# Patient Record
Sex: Female | Born: 1940 | Race: White | Hispanic: No | Marital: Married | Smoking: Never smoker
Health system: Southern US, Community
[De-identification: ages and names within clinical notes are randomized; demographics above are authoritative.]

## PROBLEM LIST (undated history)

## (undated) DIAGNOSIS — I10 Essential (primary) hypertension: Secondary | ICD-10-CM

## (undated) DIAGNOSIS — E7800 Pure hypercholesterolemia, unspecified: Secondary | ICD-10-CM

## (undated) HISTORY — DX: Pure hypercholesterolemia, unspecified: E78.00

## (undated) HISTORY — DX: Essential (primary) hypertension: I10

## (undated) HISTORY — PX: TUBAL LIGATION: SHX77

---

## 1997-03-18 HISTORY — PX: BACK SURGERY: SHX140

## 2011-11-01 ENCOUNTER — Encounter: Payer: Self-pay | Admitting: Internal Medicine

## 2011-11-01 ENCOUNTER — Ambulatory Visit (INDEPENDENT_AMBULATORY_CARE_PROVIDER_SITE_OTHER)
Admission: RE | Admit: 2011-11-01 | Discharge: 2011-11-01 | Disposition: A | Discharge status: Home / Self Care | Payer: Medicare Other | Source: Ambulatory Visit | Attending: Internal Medicine | Admitting: Internal Medicine

## 2011-11-01 ENCOUNTER — Ambulatory Visit (INDEPENDENT_AMBULATORY_CARE_PROVIDER_SITE_OTHER): Payer: Medicare Other | Admitting: Internal Medicine

## 2011-11-01 VITALS — BP 128/90 | HR 70 | Ht 61.0 in | Wt 182.0 lb

## 2011-11-01 DIAGNOSIS — R0989 Other specified symptoms and signs involving the circulatory and respiratory systems: Secondary | ICD-10-CM

## 2011-11-01 DIAGNOSIS — R0600 Dyspnea, unspecified: Secondary | ICD-10-CM

## 2011-11-01 DIAGNOSIS — R0609 Other forms of dyspnea: Secondary | ICD-10-CM

## 2011-11-01 NOTE — Patient Instructions (Addendum)
Order- CXR                                                          Dx dyspnea             PFT             6 MWT              Test for a1AT deficiency

## 2011-11-01 NOTE — Progress Notes (Signed)
11/01/11- 57 yoF never smoker  For pulmonary evaluation-Patient c/o  sob with daily activities and wheezing. Denies cough, chest pain, and chest tightness.  Sister in law is a patient here- referred.  Describes shortness of breath x 2 years, noticed especially at night and relieved by sitting up and taking deep breaths. Triggers include heat, exercise like walking. She denies a past diagnosis of asthma but told she had pneumonia-hospitalized 2011. Previously had rescue inhaler. Denies cough or chest pain. Sleeps on 2 pillows with occasional orthopnea or paroxysmal nocturnal dyspnea. Feet swell. Denies history of DVT in herself or family. Treated for high blood pressure. Heart catheterization at [facility] said to be normal in 2011 so she says she has no heart disease. Husband had been a smoker years ago. 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  Prior to Admission medications   Medication Sig Start Date End Date Taking? Authorizing Provider  albuterol (PROVENTIL HFA;VENTOLIN HFA) 108 (90 BASE) MCG/ACT inhaler Inhale 2 puffs into the lungs. 2 to 3 times a day as needed   Yes Historical Provider, MD  atenolol (TENORMIN) 25 MG tablet Take 25 mg by mouth daily.   Yes Historical Provider, MD  calcium carbonate (OS-CAL) 600 MG TABS Take 500 mg by mouth 2 (two) times daily with a meal.   Yes Historical Provider, MD  cholecalciferol (VITAMIN D) 1000 UNITS tablet Take 1,000 Units by mouth daily.   Yes Historical Provider, MD  levothyroxine (SYNTHROID, LEVOTHROID) 25 MCG tablet Take 25 mcg by mouth daily.   Yes Historical Provider, MD  Multiple Vitamins-Minerals (MULTIVITAMIN PO) Take by mouth daily.   Yes Historical Provider, MD  Omega-3 Fatty Acids (FISH OIL) 1200 MG CAPS Take 2 capsules by mouth.   Yes Historical Provider, MD  simvastatin (ZOCOR) 20 MG tablet Take 20 mg by mouth every evening.   Yes Historical Provider, MD   Past Medical History  Diagnosis  Date  . High blood pressure   . High cholesterol    Past Surgical History  Procedure Date  . Back surgery 1999  . Tubal ligation    Family History  Problem Relation Age of Onset  . Emphysema Sister   . Emphysema      niece  . Heart disease Father   . Heart disease Brother   . Kidney cancer Brother     1 kidney removed   History   Social History  . Marital Status: Married    Spouse Name: N/A    Number of Children: 2  . Years of Education: N/A   Occupational History  . retired    Social History Main Topics  . Smoking status: Never Smoker   . Smokeless tobacco: Never Used  . Alcohol Use: Yes     wine rarely  . Drug Use: No  . Sexually Active: Not on file   Other Topics Concern  . Not on file   Social History Narrative  . No narrative on file   ROS-see HPI Constitutional:   No-   weight loss, night sweats, fevers, chills, fatigue, lassitude. HEENT:   No-  headaches, difficulty swallowing, +tooth/dental problems, sore throat,       No-  Sneezing,+ itching,No- ear ache, nasal congestion, post nasal drip,  CV:  No-   chest pain, orthopnea, PND, +swelling in lower extremities, anasarca, dizziness, palpitations Resp: + shortness of breath with exertion or at rest.  No-   productive cough,  No non-productive cough,  No- coughing up of blood.              No-   change in color of mucus.  + wheezing.   Skin: No-   rash or lesions. GI:  No-   heartburn, indigestion, abdominal pain, nausea, vomiting, diarrhea,                 change in bowel habits, loss of appetite GU: No-   dysuria, change in color of urine, no urgency or frequency.  No- flank pain. MS:  No-   joint pain or swelling.  No- decreased range of motion.  No- back pain. Neuro-     nothing unusual Psych:  No- change in mood or affect. No depression or anxiety.  No memory loss.  OBJ- Physical Exam BP 128/90  Pulse 70  Ht 5\' 1"  (1.549 m)  Wt 182 lb (82.555 kg)  BMI 34.39 kg/m2  SpO2  94%  General- Alert, Oriented, Affect-appropriate, Distress- none acute. Obese, talkative. Skin- rash-none, lesions- none, excoriation- none. Seems cool and clammy, no cyanosis or flushing Lymphadenopathy- none Head- atraumatic            Eyes- Gross vision intact, PERRLA, conjunctivae and secretions clear            Ears- Hearing, canals-normal            Nose- Clear, no-Septal dev, mucus, polyps, erosion, perforation             Throat- Mallampati II , mucosa clear , drainage- none, tonsils- atrophic Neck- flexible , trachea midline, no stridor , thyroid nl, carotid no bruit Chest - symmetrical excursion , unlabored           Heart/CV- RRR , no murmur , no gallop  , no rub, nl s1 s2                           - JVD- none , edema- none, stasis changes- none, varices- none           Lung- clear to P&A, wheeze- none, cough- none , dullness-none, rub- none           Chest wall-  Abd- tender-no, distended-no, bowel sounds-present, HSM- no Br/ Gen/ Rectal- Not done, not indicated Extrem- cyanosis- none, clubbing, none, atrophy- none, strength- nl Neuro- grossly intact to observation

## 2011-11-05 NOTE — Progress Notes (Signed)
Quick Note:  Pt aware of results. ______ 

## 2011-11-08 DIAGNOSIS — J45909 Unspecified asthma, uncomplicated: Secondary | ICD-10-CM | POA: Insufficient documentation

## 2011-11-08 NOTE — Assessment & Plan Note (Signed)
Onset within the last 2 years of dyspnea associated with lying in bed and with exertion. Negative heart catheterization in 2011 and no history of venous thromboembolism or anemia. Probably obesity hypoventilation and deconditioning are important. Plan-chest x-ray, PFT, 6 minute walk test, alpha-1 antitrypsin level

## 2011-12-02 ENCOUNTER — Ambulatory Visit: Payer: Medicare Other | Admitting: Internal Medicine

## 2011-12-03 ENCOUNTER — Ambulatory Visit (INDEPENDENT_AMBULATORY_CARE_PROVIDER_SITE_OTHER): Payer: Medicare Other | Admitting: Internal Medicine

## 2011-12-03 ENCOUNTER — Encounter: Payer: Self-pay | Admitting: Internal Medicine

## 2011-12-03 VITALS — BP 102/76 | HR 82 | Ht 62.0 in | Wt 182.0 lb

## 2011-12-03 DIAGNOSIS — R0609 Other forms of dyspnea: Secondary | ICD-10-CM

## 2011-12-03 DIAGNOSIS — R0600 Dyspnea, unspecified: Secondary | ICD-10-CM

## 2011-12-03 DIAGNOSIS — Z23 Encounter for immunization: Secondary | ICD-10-CM

## 2011-12-03 DIAGNOSIS — R0989 Other specified symptoms and signs involving the circulatory and respiratory systems: Secondary | ICD-10-CM

## 2011-12-03 LAB — PULMONARY FUNCTION TEST

## 2011-12-03 MED ORDER — FLUTICASONE-SALMETEROL 100-50 MCG/DOSE IN AEPB: 1.0000 | INHALATION_SPRAY | Freq: Two times a day (BID) | RESPIRATORY_TRACT | Status: DC

## 2011-12-03 MED ORDER — FUROSEMIDE 20 MG PO TABS: ORAL_TABLET | ORAL | Status: DC

## 2011-12-03 NOTE — Progress Notes (Signed)
PFT done today. 

## 2011-12-03 NOTE — Patient Instructions (Addendum)
Script sent for lasix/ furosemide    1 daily x 3 days  See if you loose any water weight  Sample Advair 100      1 puff then rinse mouth, twice daily to stabilize your airways  You can still use the albuterol rescue inhaler every 4 hours as needed  Flu vax  pneumovax

## 2011-12-03 NOTE — Progress Notes (Signed)
Documentation for 6 Minute Walk Test 

## 2011-12-03 NOTE — Progress Notes (Signed)
11/01/11- 63 yoF never smoker  For pulmonary evaluation-Patient c/o  sob with daily activities and wheezing. Denies cough, chest pain, and chest tightness.  Sister in law is a patient here- referred.  Describes shortness of breath x 2 years, noticed especially at night and relieved by sitting up and taking deep breaths. Triggers include heat, exercise like walking. She denies a past diagnosis of asthma but told she had pneumonia-hospitalized 2011. Previously had rescue inhaler. Denies cough or chest pain. Sleeps on 2 pillows with occasional orthopnea or paroxysmal nocturnal dyspnea. Feet swell. Denies history of DVT in herself or family. Treated for high blood pressure. Heart catheterization at [facility] said to be normal in 2011 so she says she has no heart disease. Husband had been a smoker years ago. 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  12/03/11- 69 yoF never smoker  For pulmonary evaluation-Patient c/o  sob with daily activities and wheezing. Denies cough, chest pain, and chest tightness.   PCP Dr Felix Pacini No change in breathing--about same.  PFT today. Mentions easy diaphoresis and early fatigue with exertion but not as much wheeze and cough while lying down as she was having before. Rescue inhaler does help. Uses it before going to sleep. CXR 11/05/11- reviewed with her 1. No acute process.  2. Bibasilar volume loss/scarring with nonspecific bronchial wall  thickening. Slightly greater on the left than right.  Original Report Authenticated By: Consuello Bossier, M.D.  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 FEV1/FVC 0.78, TLC 79%, DLCO 61%. 6 MWT-12/03/2011-96%, 96%, 97%, 408 m. She completed walk without stopping but complained of frontal headache, bilateral calf pains and tightness in the center of her chest. She did not have a  pulmonary test limitation but angina and claudication are not excluded from this description.   ROS-see HPI Constitutional:   No-   weight loss, night sweats, fevers, chills, fatigue, lassitude. HEENT:   No-  headaches, difficulty swallowing, +tooth/dental problems, sore throat,       No-  Sneezing,+ itching,No- ear ache, nasal congestion, post nasal drip,  CV:  No-   chest pain, orthopnea, PND, +swelling in lower extremities, anasarca, dizziness, palpitations Resp: + shortness of breath with exertion or at rest.              No-   productive cough,  + little non-productive cough,  No- coughing up of blood.              No-   change in color of mucus.  + Less wheezing.   Skin: No-   rash or lesions. GI:  No-   heartburn, indigestion, abdominal pain, nausea, vomiting,  GU:  MS:  No-   joint pain or swelling.   Neuro-     nothing unusual Psych:  No- change in mood or affect. No depression or anxiety.  No memory loss.  OBJ- Physical Exam BP 102/76  Pulse 82  Ht 5\' 2"  (1.575 m)  Wt 182 lb (82.555 kg)  BMI 33.29 kg/m2  SpO2 95% General- Alert, Oriented, Affect-appropriate, Distress- none acute. Obese, talkative. Skin- rash-none, lesions- none, excoriation- none.  Lymphadenopathy- none Head- atraumatic            Eyes- Gross vision intact, PERRLA, conjunctivae and secretions clear            Ears- Hearing, canals-normal  Nose- Clear, no-Septal dev, mucus, polyps, erosion, perforation             Throat- Mallampati II , mucosa clear , drainage- none, tonsils- atrophic Neck- flexible , trachea midline, no stridor , thyroid nl, carotid no bruit Chest - symmetrical excursion , unlabored           Heart/CV- RRR , no murmur , no gallop  , no rub, nl s1 s2                           - JVD-+ trace , edema+1, stasis changes- none, varices- none           Lung- clear to P&A, wheeze- none, cough- none , dullness-none, rub- none           Chest wall-  Abd-  Br/ Gen/ Rectal- Not done,  not indicated Extrem- cyanosis- none, clubbing, none, atrophy- none, strength- nl Neuro- grossly intact to observation

## 2011-12-05 ENCOUNTER — Encounter: Payer: Self-pay | Admitting: Internal Medicine

## 2011-12-11 NOTE — Assessment & Plan Note (Addendum)
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 6 minute walk test does not suggest pulmonary limitations but raises question of circulatory(angina/claudication) limitation of exertion. This is supported by bronchial thickening on chest x-ray. There is more fluid in the neck veins and ankles today so we will see what difference a few days of Lasix and make. Meanwhile try Advair with discussion. Her primary physician can consider cardiology referral would be useful for risk stratification.

## 2012-01-14 ENCOUNTER — Ambulatory Visit (INDEPENDENT_AMBULATORY_CARE_PROVIDER_SITE_OTHER): Payer: Medicare Other | Admitting: Internal Medicine

## 2012-01-14 ENCOUNTER — Encounter: Payer: Self-pay | Admitting: Internal Medicine

## 2012-01-14 VITALS — BP 116/70 | HR 75 | Ht 61.0 in | Wt 186.0 lb

## 2012-01-14 DIAGNOSIS — J45909 Unspecified asthma, uncomplicated: Secondary | ICD-10-CM

## 2012-01-14 MED ORDER — ALBUTEROL SULFATE HFA 108 (90 BASE) MCG/ACT IN AERS: 2.0000 | INHALATION_SPRAY | RESPIRATORY_TRACT | Status: AC | PRN

## 2012-01-14 NOTE — Patient Instructions (Addendum)
Order- ONOX on room air      Dx asthma with bronchitis  Script sent to drug store for albuterol rescue inhaler to use if needed for wheeze or shortness of breath. For instance you might want to try it before bed to see if it helps the wheezing.

## 2012-01-14 NOTE — Progress Notes (Signed)
11/01/11- 10 yoF never smoker  For pulmonary evaluation-Patient c/o  sob with daily activities and wheezing. Denies cough, chest pain, and chest tightness.  Sister in law is a patient here- referred.  Describes shortness of breath x 2 years, noticed especially at night and relieved by sitting up and taking deep breaths. Triggers include heat, exercise like walking. She denies a past diagnosis of asthma but told she had pneumonia-hospitalized 2011. Previously had rescue inhaler. Denies cough or chest pain. Sleeps on 2 pillows with occasional orthopnea or paroxysmal nocturnal dyspnea. Feet swell. Denies history of DVT in herself or family. Treated for high blood pressure. Heart catheterization at [facility] said to be normal in 2011 so she says she has no heart disease. Husband had been a smoker years ago. 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  12/03/11- 60 yoF never smoker  For pulmonary evaluation-Patient c/o  sob with daily activities and wheezing. Denies cough, chest pain, and chest tightness.   PCP Dr Felix Pacini No change in breathing--about same.  PFT today. Mentions easy diaphoresis and early fatigue with exertion but not as much wheeze and cough while lying down as she was having before. Rescue inhaler does help. Uses it before going to sleep. CXR 11/05/11- reviewed with her 1. No acute process.  2. Bibasilar volume loss/scarring with nonspecific bronchial wall  thickening. Slightly greater on the left than right.  Original Report Authenticated By: Consuello Bossier, M.D.  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 FEV1/FVC 0.78, TLC 79%, DLCO 61%. 6 MWT-12/03/2011-96%, 96%, 97%, 408 m. She completed walk without stopping but complained of frontal headache, bilateral calf pains and tightness in the center of her chest. She did not have a  pulmonary test limitation but angina and claudication are not excluded from this description.  01/14/12- 31 yoF never smoker  For pulmonary evaluation-Patient c/o  sob with daily activities and wheezing. Denies cough, chest pain, and chest tightness.   PCP Dr Felix Pacini Still gets SOB with daily activities; review PFT and test with patient. Has had flu and pneumonia vaccine. Advair sample triggered productive cough with clear phlegm, but otherwise little cough or phlegm. Wheezes briefly just as she gets in bed. Neice has pulmonary hypertension. Trial of Lasix did not change her weight. a1AT MM 11/14/11- normal PFT 12/03/2011 mild obstructive airways disease with response to bronchodilator, slight restriction. FEV1 1.60/91%, FEV1/FVC 0.78, FEF 25-75% 1.4/69%. TLC 79%, DLCO 61%. - 9/17/`3-96%, 96%, 97%, 408 m. Bi-lateral calf pain and mid chest tightness with frontal headache at the end of the walk resolved with rest  ROS-see HPI Constitutional:   No-   weight loss, night sweats, fevers, chills, fatigue, lassitude. HEENT:   No-  headaches, difficulty swallowing, +tooth/dental problems, sore throat,       No-  Sneezing,+ itching,No- ear ache, nasal congestion, post nasal drip,  CV:  +chest pain, orthopnea, PND, +swelling in lower extremities, anasarca, dizziness, palpitations Resp: + shortness of breath with exertion or at rest.              + Little productive cough,  + little non-productive cough,  No- coughing up of blood.              No-   change in color of mucus.  + Less wheezing.   Skin: No-   rash or lesions. GI:  No-  heartburn, indigestion, abdominal pain, nausea, vomiting,  GU:  MS:  No-   joint pain or swelling.   Neuro-     nothing unusual Psych:  No- change in mood or affect. No depression or anxiety.  No memory loss.  OBJ- Physical Exam BP 116/70  Pulse 75  Ht 5\' 1"  (1.549 m)  Wt 186 lb (84.369 kg)  BMI 35.14 kg/m2  SpO2 93% General- Alert, Oriented,  Affect-appropriate, Distress- none acute. Overweight, talkative. Skin- rash-none, lesions- none, excoriation- none.  Lymphadenopathy- none Head- atraumatic            Eyes- Gross vision intact, PERRLA, conjunctivae and secretions clear            Ears- Hearing, canals-normal            Nose- Clear, no-Septal dev, mucus, polyps, erosion, perforation             Throat- Mallampati II , mucosa clear , drainage- none, tonsils- atrophic Neck- flexible , trachea midline, no stridor , thyroid nl, carotid no bruit Chest - symmetrical excursion , unlabored           Heart/CV- RRR , no murmur , no gallop  , no rub, nl s1 s2 (P2 not increased)                           - JVD-none , edema+1, stasis changes- none, varices- none           Lung- clear to P&A, wheeze- none, cough- none , dullness-none, rub- none           Chest wall-  Abd-  Br/ Gen/ Rectal- Not done, not indicated Extrem- cyanosis- none, clubbing, none, atrophy- none, strength- nl Neuro- grossly intact to observation

## 2012-01-26 NOTE — Assessment & Plan Note (Signed)
Consider basis for dyspnea on exertion to be asthma with bronchitis, recognizing reversible obstruction on PFTs. Chest pain, headache and leg pain at the end of the 6 minute walk need watching. I don't think she is describing angina but keep possibility in mind.

## 2012-01-29 ENCOUNTER — Telehealth: Payer: Self-pay | Admitting: Internal Medicine

## 2012-01-29 DIAGNOSIS — J45909 Unspecified asthma, uncomplicated: Secondary | ICD-10-CM

## 2012-01-29 NOTE — Telephone Encounter (Signed)
ONO results received.  Placed on CY's cart w/ copy of message.  Pt is aware CY is not in the office until tomorrow morning, she is okay.  Dr Maple Hudson please advise of ONO, thanks!

## 2012-01-30 NOTE — Telephone Encounter (Signed)
She did desaturate enough on ONOX 01/16/12 to qualify for home O2  Please order Home O2 for sleep   2 L/min    Dx Dyspnea

## 2012-01-30 NOTE — Telephone Encounter (Signed)
Pt aware of results and aware that order has been placed.

## 2012-02-14 ENCOUNTER — Encounter: Payer: Self-pay | Admitting: Internal Medicine

## 2012-05-15 ENCOUNTER — Ambulatory Visit: Payer: Medicare Other | Admitting: Internal Medicine

## 2012-06-18 ENCOUNTER — Ambulatory Visit (INDEPENDENT_AMBULATORY_CARE_PROVIDER_SITE_OTHER): Payer: Medicare Other | Admitting: Internal Medicine

## 2012-06-18 ENCOUNTER — Encounter: Payer: Self-pay | Admitting: Internal Medicine

## 2012-06-18 ENCOUNTER — Other Ambulatory Visit (INDEPENDENT_AMBULATORY_CARE_PROVIDER_SITE_OTHER): Payer: Medicare Other

## 2012-06-18 VITALS — BP 126/72 | HR 81 | Ht 61.0 in | Wt 187.0 lb

## 2012-06-18 DIAGNOSIS — R609 Edema, unspecified: Secondary | ICD-10-CM

## 2012-06-18 DIAGNOSIS — J9610 Chronic respiratory failure, unspecified whether with hypoxia or hypercapnia: Secondary | ICD-10-CM

## 2012-06-18 DIAGNOSIS — J45909 Unspecified asthma, uncomplicated: Secondary | ICD-10-CM

## 2012-06-18 LAB — BASIC METABOLIC PANEL
CO2: 32 mEq/L (ref 19–32)
Chloride: 101 mEq/L (ref 96–112)
Potassium: 3.5 mEq/L (ref 3.5–5.1)

## 2012-06-18 LAB — BRAIN NATRIURETIC PEPTIDE: Pro B Natriuretic peptide (BNP): 349 pg/mL — ABNORMAL HIGH (ref 0.0–100.0)

## 2012-06-18 MED ORDER — FUROSEMIDE 20 MG PO TABS: ORAL_TABLET | ORAL | Status: AC

## 2012-06-18 NOTE — Progress Notes (Signed)
11/01/11- 67 yoF never smoker  For pulmonary evaluation-Patient c/o  sob with daily activities and wheezing. Denies cough, chest pain, and chest tightness.  Sister in law is a patient here- referred.  Describes shortness of breath x 2 years, noticed especially at night and relieved by sitting up and taking deep breaths. Triggers include heat, exercise like walking. She denies a past diagnosis of asthma but told she had pneumonia-hospitalized 2011. Previously had rescue inhaler. Denies cough or chest pain. Sleeps on 2 pillows with occasional orthopnea or paroxysmal nocturnal dyspnea. Feet swell. Denies history of DVT in herself or family. Treated for high blood pressure. Heart catheterization at [facility] said to be normal in 2011 so she says she has no heart disease. Husband had been a smoker years ago. 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  12/03/11- 33 yoF never smoker  For pulmonary evaluation-Patient c/o  sob with daily activities and wheezing. Denies cough, chest pain, and chest tightness.   PCP Dr Felix Pacini No change in breathing--about same.  PFT today. Mentions easy diaphoresis and early fatigue with exertion but not as much wheeze and cough while lying down as she was having before. Rescue inhaler does help. Uses it before going to sleep. CXR 11/05/11- reviewed with her 1. No acute process.  2. Bibasilar volume loss/scarring with nonspecific bronchial wall  thickening. Slightly greater on the left than right.  Original Report Authenticated By: Consuello Bossier, M.D.  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 FEV1/FVC 0.78, TLC 79%, DLCO 61%. 6 MWT-12/03/2011-96%, 96%, 97%, 408 m. She completed walk without stopping but complained of frontal headache, bilateral calf pains and tightness in the center of her chest. She did not have a  pulmonary test limitation but angina and claudication are not excluded from this description.  01/14/12- 73 yoF never smoker  For pulmonary evaluation-Patient c/o  sob with daily activities and wheezing. Denies cough, chest pain, and chest tightness.   PCP Dr Felix Pacini Still gets SOB with daily activities; review PFT and test with patient. Has had flu and pneumonia vaccine. Advair sample triggered productive cough with clear phlegm, but otherwise little cough or phlegm. Wheezes briefly just as she gets in bed. Neice has pulmonary hypertension. Trial of Lasix did not change her weight. a1AT MM 11/14/11- normal PFT 12/03/2011 mild obstructive airways disease with response to bronchodilator, slight restriction. FEV1 1.60/91%, FEV1/FVC 0.78, FEF 25-75% 1.4/69%. TLC 79%, DLCO 61%. - 9/17/`3-96%, 96%, 97%, 408 m. Bi-lateral calf pain and mid chest tightness with frontal headache at the end of the walk resolved with rest  06/18/12- 71 yoF never smoker  For pulmonary evaluation-Patient c/o  sob with daily activities and wheezing. Denies cough, chest pain, and chest tightness.   PCP Dr Christiane Ha Hinson/ [place] FOLLOWS FOR: continues to have SOB with activity. More dyspneic supine. Dependent edema Overnight oximetry 01/16/2012-qualified for nocturnal oxygen.  She has been using continuous oxygen at 2 L for sleep/ [facility]. CXR 11/05/11 IMPRESSION:  1. No acute process.  2. Bibasilar volume loss/scarring with nonspecific bronchial wall  thickening. Slightly greater on the left than right.  Original Report Authenticated By: Consuello Bossier, M.D.   ROS-see HPI Constitutional:   No-   weight loss, night sweats, fevers, chills, fatigue, lassitude. HEENT:   No-  headaches, difficulty swallowing, +tooth/dental problems, sore throat,       No-  Sneezing,no- itching,No- ear ache, nasal congestion, post nasal drip,  CV:  +chest pain, orthopnea, PND, +swelling in lower extremities, anasarca,  dizziness, palpitations Resp: + shortness of breath with exertion or at rest.              + Little productive cough,  + little non-productive cough,  No- coughing up of blood.              No-   change in color of mucus.  + little wheezing.   Skin: No-   rash or lesions. GI:  No-   heartburn, indigestion, abdominal pain, nausea, vomiting,  GU:  MS:  No-   joint pain or swelling.   Neuro-     nothing unusual Psych:  No- change in mood or affect. No depression or anxiety.  No memory loss.  OBJ- Physical Exam BP 126/72  Pulse 81  Ht 5\' 1"  (1.549 m)  Wt 187 lb (84.823 kg)  BMI 35.35 kg/m2  SpO2 96%  General- Alert, Oriented, Affect-appropriate, Distress- none acute. Overweight, talkative. Skin- rash-none, lesions- none, excoriation- none.  Lymphadenopathy- none Head- atraumatic            Eyes- Gross vision intact, PERRLA, conjunctivae and secretions clear            Ears- Hearing, canals-normal            Nose- Clear, no-Septal dev, mucus, polyps, erosion, perforation             Throat- Mallampati II , mucosa clear , drainage- none, tonsils- atrophic Neck- flexible , trachea midline, no stridor , thyroid nl, carotid no bruit Chest - symmetrical excursion , unlabored           Heart/CV- RRR , no murmur , no gallop  , no rub, nl s1 s2 (P2 not increased)                           - JVD-none , edema+3/ bulging out of shoes, stasis changes- none, varices- none           Lung- clear to P&A, wheeze- none, cough- none , dullness-none, rub- none           Chest wall-  Abd-  Br/ Gen/ Rectal- Not done, not indicated Extrem- cyanosis- none, clubbing, none, atrophy- none, strength- nl Neuro- grossly intact to observation

## 2012-06-18 NOTE — Patient Instructions (Addendum)
Order- lab- BMET, BNP    Dx dyspnea, peripheral edema  Continue oxygen at night 2 L/M  Order- print script to give to your DME company  For portable O2 concentrator 2 L/M for travel    Dx chronic respiratory failure   Script sent for lasix to help with fluid retention

## 2012-06-27 DIAGNOSIS — R609 Edema, unspecified: Secondary | ICD-10-CM | POA: Insufficient documentation

## 2012-06-27 NOTE — Assessment & Plan Note (Signed)
PFTs don't look bad enough blaming this as cor pulmonale. We're looking at cardiac status and renal function. Plan-short trial of Lasix to assess response.

## 2012-06-27 NOTE — Assessment & Plan Note (Signed)
Chronic hypoxic respiratory failure and peripheral edema seem worse than predicted by 6 minute walk test and PFT. 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 Uncertain cardiac component. Plan-continue oxygen. Short trial of Lasix. BMET, BNP.

## 2012-12-17 ENCOUNTER — Ambulatory Visit: Payer: Medicare Other | Admitting: Internal Medicine

## 2013-01-08 ENCOUNTER — Ambulatory Visit (INDEPENDENT_AMBULATORY_CARE_PROVIDER_SITE_OTHER): Payer: Medicare Other | Admitting: Internal Medicine

## 2013-01-08 ENCOUNTER — Encounter: Payer: Self-pay | Admitting: Internal Medicine

## 2013-01-08 ENCOUNTER — Ambulatory Visit (INDEPENDENT_AMBULATORY_CARE_PROVIDER_SITE_OTHER)
Admission: RE | Admit: 2013-01-08 | Discharge: 2013-01-08 | Disposition: A | Discharge status: Home / Self Care | Payer: Medicare Other | Source: Ambulatory Visit | Attending: Internal Medicine | Admitting: Internal Medicine

## 2013-01-08 ENCOUNTER — Other Ambulatory Visit (INDEPENDENT_AMBULATORY_CARE_PROVIDER_SITE_OTHER): Payer: Medicare Other

## 2013-01-08 VITALS — BP 128/78 | HR 70 | Ht 61.0 in | Wt 186.4 lb

## 2013-01-08 DIAGNOSIS — J45909 Unspecified asthma, uncomplicated: Secondary | ICD-10-CM

## 2013-01-08 DIAGNOSIS — R609 Edema, unspecified: Secondary | ICD-10-CM

## 2013-01-08 DIAGNOSIS — R0600 Dyspnea, unspecified: Secondary | ICD-10-CM

## 2013-01-08 DIAGNOSIS — R0609 Other forms of dyspnea: Secondary | ICD-10-CM

## 2013-01-08 DIAGNOSIS — R0989 Other specified symptoms and signs involving the circulatory and respiratory systems: Secondary | ICD-10-CM

## 2013-01-08 LAB — BASIC METABOLIC PANEL
CO2: 35 mEq/L — ABNORMAL HIGH (ref 19–32)
Chloride: 100 mEq/L (ref 96–112)
Potassium: 4 mEq/L (ref 3.5–5.1)

## 2013-01-08 LAB — BRAIN NATRIURETIC PEPTIDE: Pro B Natriuretic peptide (BNP): 187 pg/mL — ABNORMAL HIGH (ref 0.0–100.0)

## 2013-01-08 NOTE — Progress Notes (Signed)
11/01/11- 27 yoF never smoker  For pulmonary evaluation-Patient c/o  sob with daily activities and wheezing. Denies cough, chest pain, and chest tightness.  Sister in law is a patient here- referred.  Describes shortness of breath x 2 years, noticed especially at night and relieved by sitting up and taking deep breaths. Triggers include heat, exercise like walking. She denies a past diagnosis of asthma but told she had pneumonia-hospitalized 2011. Previously had rescue inhaler. Denies cough or chest pain. Sleeps on 2 pillows with occasional orthopnea or paroxysmal nocturnal dyspnea. Feet swell. Denies history of DVT in herself or family. Treated for high blood pressure. Heart catheterization at [facility] said to be normal in 2011 so she says she has no heart disease. Husband had been a smoker years ago. 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  12/03/11- 54 yoF never smoker  For pulmonary evaluation-Patient c/o  sob with daily activities and wheezing. Denies cough, chest pain, and chest tightness.   PCP Dr Felix Pacini No change in breathing--about same.  PFT today. Mentions easy diaphoresis and early fatigue with exertion but not as much wheeze and cough while lying down as she was having before. Rescue inhaler does help. Uses it before going to sleep. CXR 11/05/11- reviewed with her 1. No acute process.  2. Bibasilar volume loss/scarring with nonspecific bronchial wall  thickening. Slightly greater on the left than right.  Original Report Authenticated By: Consuello Bossier, M.D.  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 FEV1/FVC 0.78, TLC 79%, DLCO 61%. 6 MWT-12/03/2011-96%, 96%, 97%, 408 m. She completed walk without stopping but complained of frontal headache, bilateral calf pains and tightness in the center of her chest. She did not have a  pulmonary test limitation but angina and claudication are not excluded from this description.  01/14/12- 12 yoF never smoker  For pulmonary evaluation-Patient c/o  sob with daily activities and wheezing. Denies cough, chest pain, and chest tightness.   PCP Dr Felix Pacini Still gets SOB with daily activities; review PFT and test with patient. Has had flu and pneumonia vaccine. Advair sample triggered productive cough with clear phlegm, but otherwise little cough or phlegm. Wheezes briefly just as she gets in bed. Neice has pulmonary hypertension. Trial of Lasix did not change her weight. a1AT MM 11/14/11- normal PFT 12/03/2011 mild obstructive airways disease with response to bronchodilator, slight restriction. FEV1 1.60/91%, FEV1/FVC 0.78, FEF 25-75% 1.4/69%. TLC 79%, DLCO 61%. - 9/17/`3-96%, 96%, 97%, 408 m. Bi-lateral calf pain and mid chest tightness with frontal headache at the end of the walk resolved with rest  06/18/12- 71 yoF never smoker  For pulmonary evaluation-Patient c/o  sob with daily activities and wheezing. Denies cough, chest pain, and chest tightness.   PCP Dr Christiane Ha Hinson/ [place] FOLLOWS FOR: continues to have SOB with activity. More dyspneic supine. Dependent edema Overnight oximetry 01/16/2012-qualified for nocturnal oxygen.  She has been using continuous oxygen at 2 L for sleep/ Advanced. CXR 11/05/11 IMPRESSION:  1. No acute process.  2. Bibasilar volume loss/scarring with nonspecific bronchial wall  thickening. Slightly greater on the left than right.  Original Report Authenticated By: Consuello Bossier, M.D.  01/08/13- 12 yoF never smoker followedd for asthma/ bronchitis .   PCP Dr Christiane Ha Hinson/ [place] FOLLOWS FOR:  Breathing is doing well, just continues to have some SOB w/ exertion On Lasix for peripheral  edema. Occasional wheeze/rescue inhaler. Continues oxygen 2 L/Advanced for sleep. Nasal congestion.  ROS-see HPI Constitutional:   No-    weight loss, night sweats, fevers, chills, fatigue, lassitude. HEENT:   No-  headaches, difficulty swallowing, +tooth/dental problems, sore throat,       No-  Sneezing,no- itching,No- ear ache,+nasal congestion, post nasal drip,  CV:  +chest pain, orthopnea, PND, +swelling in lower extremities, anasarca, dizziness, palpitations Resp: + shortness of breath with exertion or at rest.              + Little productive cough,  + little non-productive cough,  No- coughing up of blood.              No-   change in color of mucus.  + little wheezing.   Skin: No-   rash or lesions. GI:  No-   heartburn, indigestion, abdominal pain, nausea, vomiting,  GU:  MS:  No-   joint pain or swelling.   Neuro-     nothing unusual Psych:  No- change in mood or affect. No depression or anxiety.  No memory loss.  OBJ- Physical Exam General- Alert, Oriented, Affect-appropriate, Distress- none acute.  Skin- rash-none, lesions- none, excoriation- none.  Lymphadenopathy- none Head- atraumatic            Eyes- Gross vision intact, PERRLA, conjunctivae and secretions clear            Ears- Hearing, canals-normal            Nose- Clear, no-Septal dev, mucus, polyps, erosion, perforation             Throat- Mallampati II , mucosa clear , drainage- none, tonsils- atrophic Neck- flexible , trachea midline, no stridor , thyroid nl, carotid no bruit Chest - symmetrical excursion , unlabored           Heart/CV- RRR , no murmur , no gallop  , no rub, nl s1 s2 (P2 not increased)                           - JVD-none , edema+2, stasis changes- none, varices- none           Lung- +crackles in bases, wheeze- none, cough- none , dullness-none, rub- none           Chest wall-  Abd-  Br/ Gen/ Rectal- Not done, not indicated Extrem- cyanosis- none, clubbing, none, atrophy- none, strength- nl Neuro- grossly intact to observation

## 2013-01-08 NOTE — Patient Instructions (Addendum)
Order- CXR  Dx asthma with bronchitis                [place] b- BNP BMET-     Dx Dyspnea, peripheral edema  Ask your husband whether you snore a lot or seem to stop breathing in your sleep  Ask your primary doctor if he wants you to keep taking the lasix diuretic for your swollen feet

## 2013-01-24 ENCOUNTER — Encounter: Payer: Self-pay | Admitting: Internal Medicine

## 2013-01-24 NOTE — Assessment & Plan Note (Addendum)
Obstructive airways disease is not sufficient to explain her oxygen dependence for peripheral edema. Her obesity and probable cardiac components contribute. She will discuss management of her peripheral edema with her primary physician. While here, we can check renal function. Discussed possibility of obstructive sleep apnea and she will ask her husband if she snores

## 2013-01-24 NOTE — Assessment & Plan Note (Signed)
Controlled with occasional use of rescue inhaler. Needs to add humidifier to her home oxygen and use nasal saline spray for dryness. Order- CXR

## 2013-07-09 ENCOUNTER — Encounter: Payer: Self-pay | Admitting: Internal Medicine

## 2013-07-09 ENCOUNTER — Ambulatory Visit (INDEPENDENT_AMBULATORY_CARE_PROVIDER_SITE_OTHER): Payer: Medicare HMO | Admitting: Internal Medicine

## 2013-07-09 VITALS — BP 122/68 | HR 101 | Ht 61.0 in | Wt 188.0 lb

## 2013-07-09 DIAGNOSIS — J309 Allergic rhinitis, unspecified: Secondary | ICD-10-CM

## 2013-07-09 DIAGNOSIS — J45909 Unspecified asthma, uncomplicated: Secondary | ICD-10-CM

## 2013-07-09 DIAGNOSIS — R609 Edema, unspecified: Secondary | ICD-10-CM

## 2013-07-09 DIAGNOSIS — J302 Other seasonal allergic rhinitis: Secondary | ICD-10-CM

## 2013-07-09 NOTE — Patient Instructions (Signed)
You are doing very well, so we can save you the need to make trips here, and have you see Dr Steele BergHinson for help with your breathing problems, medication refills and etc. If you need us we will be happy to see you again- please call.

## 2013-07-09 NOTE — Progress Notes (Signed)
11/01/11- [age] yoF never smoker  For pulmonary evaluation-Patient c/o  sob with daily activities and wheezing. Denies cough, chest pain, and chest tightness.  Sister in law is a patient here- referred.  Describes shortness of breath x 2 years, noticed especially at night and relieved by sitting up and taking deep breaths. Triggers include heat, exercise like walking. She denies a past diagnosis of asthma but told she had pneumonia-hospitalized 2011. Previously had rescue inhaler. Denies cough or chest pain. Sleeps on 2 pillows with occasional orthopnea or paroxysmal nocturnal dyspnea. Feet swell. Denies history of DVT in herself or family. Treated for high blood pressure. [facility] said to be normal in 2011 so she says she has no heart disease. Husband had been a smoker years ago. 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  12/03/11- [age] yoF never smoker  For pulmonary evaluation-Patient c/o  sob with daily activities and wheezing. Denies cough, chest pain, and chest tightness.   PCP Dr Felix PaciniJonathan Hinson No change in breathing--about same.  PFT today. Mentions easy diaphoresis and early fatigue with exertion but not as much wheeze and cough while lying down as she was having before. Rescue inhaler does help. Uses it before going to sleep. CXR 11/05/11- reviewed with her 1. No acute process.  2. Bibasilar volume loss/scarring with nonspecific bronchial wall  thickening. Slightly greater on the left than right.  Original Report Authenticated By: Consuello BossierKYLE D. TALBOT, M.D.  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 FEV1/FVC 0.78, TLC 79%, DLCO 61%. 6 MWT-12/03/2011-96%, 96%, 97%, 408 m. She completed walk without stopping but complained of frontal headache, bilateral calf pains and tightness in the center of her chest. She did not have a  pulmonary test limitation but angina and claudication are not excluded from this description.  01/14/12- [age] yoF never smoker  For pulmonary evaluation-Patient c/o  sob with daily activities and wheezing. Denies cough, chest pain, and chest tightness.   PCP Dr Felix PaciniJonathan Hinson Still gets SOB with daily activities; review PFT and 6MW test with patient. Has had flu and pneumonia vaccine. Advair sample triggered productive cough with clear phlegm, but otherwise little cough or phlegm. Wheezes briefly just as she gets in bed. Neice has pulmonary hypertension. Trial of Lasix did not change her weight. a1AT MM 11/14/11- normal PFT 12/03/2011 mild obstructive airways disease with response to bronchodilator, slight restriction. FEV1 1.60/91%, FEV1/FVC 0.78, FEF 25-75% 1.4/69%. TLC 79%, DLCO 61%. 6MWT- 9/17/`3-96%, 96%, 97%, 408 m. Bi-lateral calf pain and mid chest tightness with frontal headache at the end of the walk resolved with rest  06/18/12- 71 yoF never smoker  For pulmonary evaluation-Patient c/o  sob with daily activities and wheezing. Denies cough, chest pain, and chest tightness.   PCP Dr Christiane HaJonathan Hinson/ [place] FOLLOWS FOR: continues to have SOB with activity. More dyspneic supine. Dependent edema Overnight oximetry 01/16/2012-qualified for nocturnal oxygen.  She has been using continuous oxygen at 2 L for sleep/ Advanced. CXR 11/05/11 IMPRESSION:  1. No acute process.  2. Bibasilar volume loss/scarring with nonspecific bronchial wall  thickening. Slightly greater on the left than right.  Original Report Authenticated By: Consuello BossierKYLE D. TALBOT, M.D.  01/08/13- [age] yoF never smoker followed for asthma/ bronchitis .   PCP Dr Christiane HaJonathan Hinson/ [place] FOLLOWS FOR:  Breathing is doing well, just continues to have some SOB w/ exertion On Lasix for peripheral  edema. Occasional wheeze/rescue inhaler. Continues oxygen 2 L/Advanced for sleep. Nasal congestion.  07/09/13- 73 yoF never smoker followed for  asthma/ bronchitis .   PCP Dr Christiane HaJonathan Hinson/ [place] Continues oxygen 2 L/Advanced for sleep FOLLOWS FOR: sinus congestion, prod cough with clear mucus, blames the pollen for this.  SOB with exertion.  No other complaints at this time.  She says pollen symptoms are minor. Using OTC nasal spray occasionally and loratadine-discussed. Rescue inhaler less than once a week. CXR 01/11/13 IMPRESSION:  1. No acute findings.  2. Scarring at the left lung base.  Electronically Signed  By: Herbie BaltimoreWalt Liebkemann M.D.  On: 01/08/2013 15:18  ROS-see HPI Constitutional:   No-   weight loss, night sweats, fevers, chills, fatigue, lassitude. HEENT:   No-  headaches, difficulty swallowing, +tooth/dental problems, sore throat,       No-  Sneezing,no- itching,No- ear ache,+nasal congestion, post nasal drip,  CV:  No-chest pain, orthopnea, PND, +swelling in lower extremities, anasarca, dizziness, palpitations Resp: + shortness of breath with exertion or at rest.              + Little productive cough,  + little non-productive cough,  No- coughing up of blood.                  No-   change in color of mucus.  + little wheezing.   Skin: No-   rash or lesions. GI:  No-   heartburn, indigestion, abdominal pain, nausea, vomiting,  GU:  MS:  No-   joint pain or swelling.   Neuro-     nothing unusual Psych:  No- change in mood or affect. No depression or anxiety.  No memory loss.  OBJ- Physical Exam General- Alert, Oriented, Affect-appropriate, Distress- none acute. +overweight Skin- rash-none, lesions- none, excoriation- none.  Lymphadenopathy- none Head- atraumatic            Eyes- Gross vision intact, PERRLA, conjunctivae and secretions clear            Ears- Hearing, canals-normal            Nose- Clear, no-Septal dev, mucus, polyps, erosion, perforation             Throat- Mallampati II , mucosa clear , drainage- none, tonsils- atrophic Neck- flexible , trachea midline, no stridor , thyroid nl, carotid  no bruit Chest - symmetrical excursion , unlabored           Heart/CV- RRR , no murmur , no gallop  , no rub, nl s1 s2 (P2 not increased)                           - JVD-none , edema+2, stasis changes- none, varices- none           Lung- clear, wheeze- none, cough- none , dullness-none, rub- none           Chest wall-  Abd-  Br/ Gen/ Rectal- Not done, not indicated Extrem- cyanosis- none, clubbing, none, atrophy- none, strength- nl Neuro- grossly intact to observation

## 2013-08-06 DIAGNOSIS — J302 Other seasonal allergic rhinitis: Secondary | ICD-10-CM | POA: Insufficient documentation

## 2013-08-06 NOTE — Assessment & Plan Note (Signed)
controlled 

## 2013-08-06 NOTE — Assessment & Plan Note (Signed)
Unchanged

## 2013-08-06 NOTE — Assessment & Plan Note (Signed)
Well controlled without changes needed. Occasional rescue inhaler has been sufficient. Plan-she is doing so well that I suggested she return to see [place] as needed.

## 2014-05-17 IMAGING — CR DG CHEST 2V
2 series · 2 of 2 positions shown · non-contrast
Comparison: None.

CLINICAL DATA: Chronic shortness of breath.  Nonsmoker.

CHEST - 2 VIEW

[view not recorded (1 of 2)]
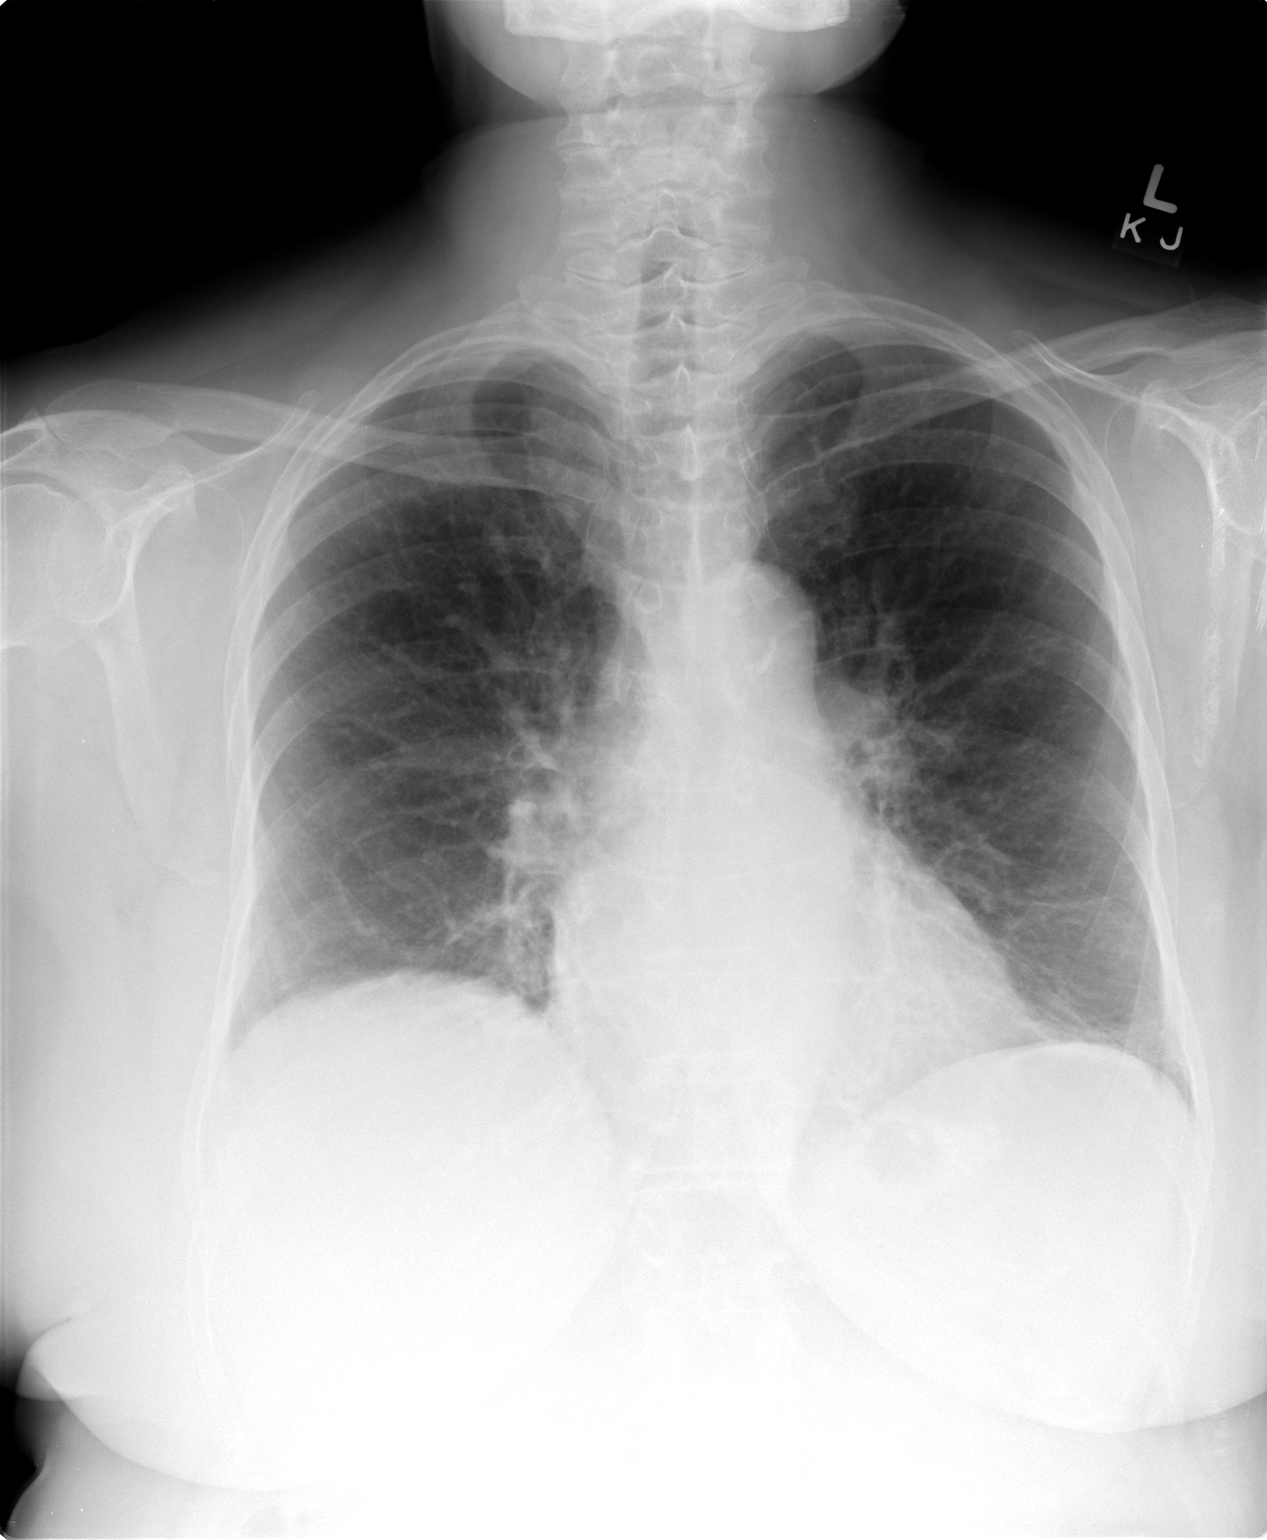

[view not recorded (2 of 2)]
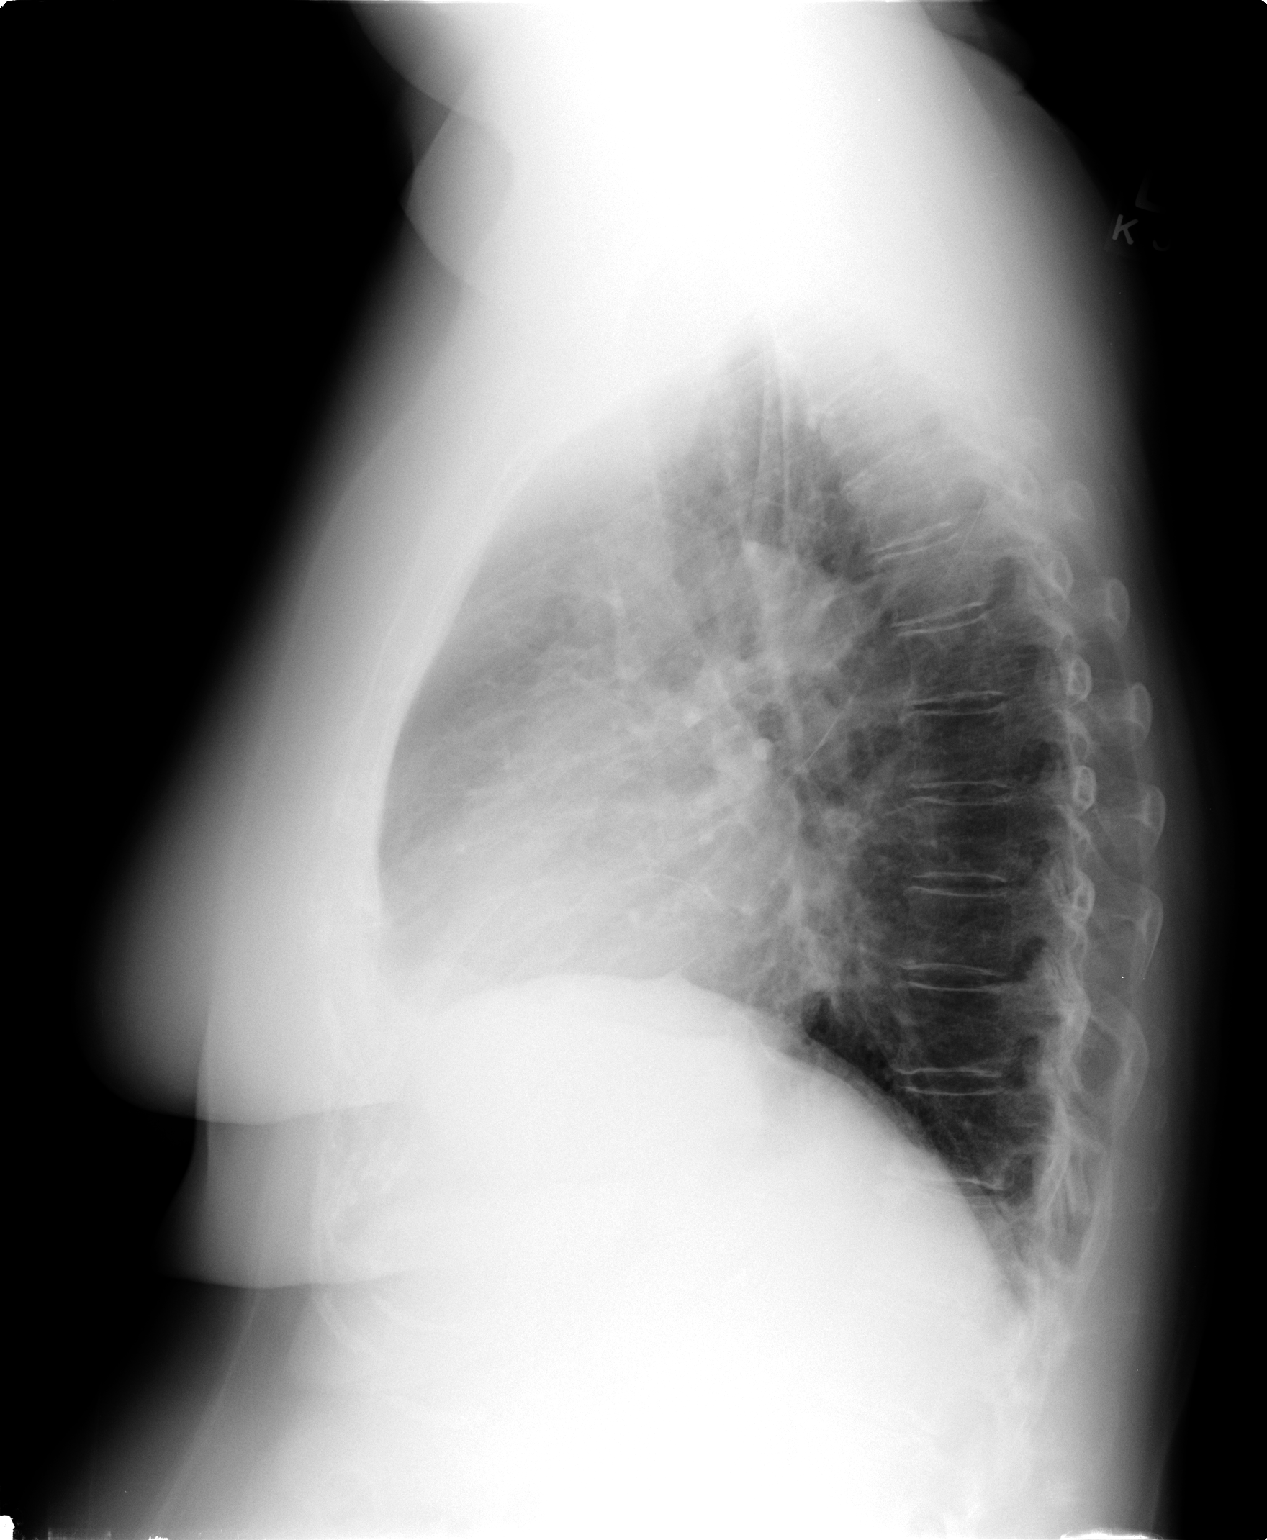

[2 of 2 positions shown; findings below may reference images not displayed]

FINDINGS: Artifact projecting over the left scapula on the frontal
view. Midline trachea.  Normal heart size.  Aortic atherosclerosis
involving the transverse segment. No pleural effusion or
pneumothorax.  There is moderate interstitial thickening, slightly
greater on the left than right and lower lobe predominant.  Mild
right hemidiaphragm effusion.  Volume loss and infrahilar regions
bilaterally.  Increased retrocardiac density on the lateral view is
favored to be due to volume loss and bronchial wall thickening.
IMPRESSION: 1.  No acute process.
2.  Bibasilar volume loss/scarring with nonspecific bronchial wall
thickening.  Slightly greater on the left than right.
